# Patient Record
Sex: Male | Born: 1960
Health system: Southern US, Community
[De-identification: ages and names within clinical notes are randomized; demographics above are authoritative.]

## PROBLEM LIST (undated history)

## (undated) DIAGNOSIS — Q141 Congenital malformation of retina: Secondary | ICD-10-CM

## (undated) DIAGNOSIS — N189 Chronic kidney disease, unspecified: Secondary | ICD-10-CM

## (undated) HISTORY — DX: Congenital malformation of retina: Q14.1

## (undated) HISTORY — DX: Chronic kidney disease, unspecified: N18.9

---

## 2009-04-26 HISTORY — PX: COLONOSCOPY: SHX174

## 2009-04-28 ENCOUNTER — Encounter (INDEPENDENT_AMBULATORY_CARE_PROVIDER_SITE_OTHER): Payer: Self-pay | Admitting: *Deleted

## 2009-05-12 ENCOUNTER — Encounter (INDEPENDENT_AMBULATORY_CARE_PROVIDER_SITE_OTHER): Payer: Self-pay

## 2009-05-15 ENCOUNTER — Ambulatory Visit: Payer: Self-pay | Admitting: Internal Medicine

## 2009-05-22 ENCOUNTER — Ambulatory Visit: Payer: Self-pay | Admitting: Internal Medicine

## 2010-05-26 NOTE — Letter (Signed)
Summary: [facility] Instructions  [facility]  [address] [place], [place] [address]   Phone: [phone]  Fax: [phone]       Gary Chandler    11-07-1960    MRN: [id number]       Procedure Day /Date:  Thursday 05/22/2009     Arrival Time: 9:30 am     Procedure Time:  10:30 am     Location of Procedure:                    _ x_  [facility] (4th Floor) _   PREPARATION FOR COLONOSCOPY WITH MIRALAX  Starting 5 days prior to your procedure Saturday 1/22  do not eat nuts, seeds, popcorn, corn, beans, peas,  salads, or any raw vegetables.  Do not take any fiber supplements (e.g. Metamucil, Citrucel, and Benefiber). ____________________________________________________________________________________________________   THE DAY BEFORE YOUR PROCEDURE         DATE:Wednesday 1/26  1   Drink clear liquids the entire day-NO SOLID FOOD  2   Do not drink anything colored red or purple.  Avoid juices with pulp.  No orange juice.  3   Drink at least 64 oz. (8 glasses) of fluid/clear liquids during the day to prevent dehydration and help the prep work efficiently.  CLEAR LIQUIDS INCLUDE: Water Jello Ice Popsicles Tea (sugar ok, no milk/cream) Powdered fruit flavored drinks Coffee (sugar ok, no milk/cream) Gatorade Juice: apple, white grape, white cranberry  Lemonade Clear bullion, consomm, broth Carbonated beverages (any kind) Strained chicken noodle soup Hard Candy  4   Mix the entire bottle of Miralax with 64 oz. of Gatorade/Powerade in the morning and put in the refrigerator to chill.  5   At 3:00 pm take 2 Dulcolax/Bisacodyl tablets.  6   At 4:30 pm take one Reglan/Metoclopramide tablet.  7  Starting at 5:00 pm drink one 8 oz glass of the Miralax mixture every 15-20 minutes until you have finished drinking the entire 64 oz.  You should finish drinking prep around 7:30 or 8:00 pm.  8   If you are nauseated, you may take the 2nd  Reglan/Metoclopramide tablet at 6:30 pm.        9    At 8:00 pm take 2 more DULCOLAX/Bisacodyl tablets.     THE DAY OF YOUR PROCEDURE      DATE:  Thursday 1/27  You may drink clear liquids until 8:30 am  (2 HOURS BEFORE PROCEDURE).   MEDICATION INSTRUCTIONS  Unless otherwise instructed, you should take regular prescription medications with a small sip of water as early as possible the morning of your procedure.          OTHER INSTRUCTIONS  You will need a responsible adult at least 52 years of age to accompany you and drive you home.   This person must remain in the waiting room during your procedure.  Wear loose fitting clothing that is easily removed.  Leave jewelry and other valuables at home.  However, you may wish to bring a book to read or an iPod/MP3 player to listen to music as you wait for your procedure to start.  Remove all body piercing jewelry and leave at home.  Total time from sign-in until discharge is approximately 2-3 hours.  You should go home directly after your procedure and rest.  You can resume normal activities the day after your procedure.  The day of your procedure you should not:   Drive  Make legal decisions   Operate machinery   Drink alcohol   Return to work  You will receive specific instructions about eating, activities and medications before you leave.   The above instructions have been reviewed and explained to me by   Doristine Church RN II  May 15, 2009 11:06 AM    I fully understand and can verbalize these instructions _____________________________ Date _______

## 2010-05-26 NOTE — Letter (Signed)
Summary: Previsit letter  [facility]  [address] [place], [place] [address]   Phone: [phone]  Fax: [phone]       04/28/2009 MRN: [id number]  Gary Chandler [address] [place], [place]  [address]  Dear [name],  Welcome to the Gastroenterology Division at [facility].    You are scheduled to see a nurse for your pre-procedure visit on 05/15/2009 at 11:00am on the 3rd floor at [facility], [address]  We ask that you try to arrive at our office 15 minutes prior to your appointment time to allow for check-in.  Your nurse visit will consist of discussing your medical and surgical history, your immediate family medical history, and your medications.    Please bring a complete list of all your medications or, if you prefer, bring the medication bottles and we will list them.  We will need to be aware of both prescribed and over the counter drugs.  We will need to know exact dosage information as well.  If you are on blood thinners (Coumadin, Plavix, Aggrenox, Ticlid, etc.) please call our office today/prior to your appointment, as we need to consult with your physician about holding your medication.   Please be prepared to read and sign documents such as consent forms, a financial agreement, and acknowledgement forms.  If necessary, and with your consent, a friend or relative is welcome to sit-in on the nurse visit with you.  Please bring your insurance card so that we may make a copy of it.  If your insurance requires a referral to see a specialist, please bring your referral form from your primary care physician.  No co-pay is required for this nurse visit.     If you cannot keep your appointment, please call [phone] to cancel or reschedule prior to your appointment date.  This allows [place] the opportunity to schedule an appointment for another patient in need of care.    Thank you for choosing  Gastroenterology for your medical  needs.  We appreciate the opportunity to care for you.  Please visit [place] at our website  to learn more about our practice.                     Sincerely.                                                                                                                   The Gastroenterology Division

## 2010-05-26 NOTE — Procedures (Signed)
Summary: Colonoscopy  Patient: Adonias Demore Note: All result statuses are Final unless otherwise noted.  Tests: (1) Colonoscopy (COL)   COL Colonoscopy           DONE     [facility]     [address]     [place], [place]  [address]           COLONOSCOPY PROCEDURE REPORT           PATIENT:  Righteous, Claiborne  MR#:  [id number]     BIRTHDATE:  Feb 25, 1961, 48 yrs. old  GENDER:  male           ENDOSCOPIST:  Hedwig Morton. Juanda Chance, MD     Referred by:  Mila Palmer, M.D.           PROCEDURE DATE:  05/22/2009     PROCEDURE:  Colonoscopy [address]     ASA CLASS:  Class I     INDICATIONS:  Routine Risk Screening           MEDICATIONS:   Versed 9 mg, Fentanyl 100 mcg           DESCRIPTION OF PROCEDURE:   After the risks benefits and     alternatives of the procedure were thoroughly explained, informed     consent was obtained.  Digital rectal exam was performed and     revealed no rectal masses.   The LB [id number]-[id number] endoscope     was introduced through the anus and advanced to the cecum, which     was identified by both the appendix and ileocecal valve, without     limitations.  The quality of the prep was good, using MiraLax.     The instrument was then slowly withdrawn as the colon was fully     examined.     <<PROCEDUREIMAGES>>           FINDINGS:  No polyps or cancers were seen (see image1, image2, and     image3). there was a suspicion for a small polyp at 80 cm but I     could not find it again after scope has telescoped and lumen     irrigated   Retroflexion was not performed.  The scope was then     withdrawn from the patient and the procedure completed.           COMPLICATIONS:  None           ENDOSCOPIC IMPRESSION:     1) No polyps or cancers     2) Normal colonoscopy     RECOMMENDATIONS:     1) high fiber diet           REPEAT EXAM:  In 10 year(s) for.           ______________________________     Hedwig Morton. Juanda Chance, MD           CC:           n.     eSIGNED:    Hedwig Morton. Shakaria Raphael at 05/22/2009 11:30 AM           Crissie Reese, [phone]  Note: An exclamation mark (!) indicates a result that was not dispersed into the flowsheet. Document Creation Date: 05/22/2009 11:30 AM _______________________________________________________________________  (1) Order result status: Final Collection or observation date-time: 05/22/2009 11:20 Requested date-time:  Receipt date-time:  Reported date-time:  Referring Physician:   Ordering Physician: Lina Sar [phone]) Specimen Source:  Source: Launa Grill  Order Number: [id number] Lab site:

## 2010-05-26 NOTE — Miscellaneous (Signed)
Summary: [facility] previsit-prep  Clinical Lists Changes  Medications: Added new medication of MIRALAX   POWD (POLYETHYLENE GLYCOL 3350) As per prep  instructions. - Signed Added new medication of REGLAN 10 MG  TABS (METOCLOPRAMIDE HCL) As per prep instructions. - Signed Added new medication of DULCOLAX 5 MG  TBEC (BISACODYL) Day before procedure take 2 at 3pm and 2 at 8pm. - Signed Rx of MIRALAX   POWD (POLYETHYLENE GLYCOL 3350) As per prep  instructions.;  #255gm x 0;  Signed;  Entered by: Doristine Church RN II;  Authorized by: Hart Carwin MD;  Method used: Electronically to [facility] [place] [address] [phone]*, [address], [place], [place]  [address], Ph: [phone], Fax: [phone] Rx of REGLAN 10 MG  TABS (METOCLOPRAMIDE HCL) As per prep instructions.;  #2 x 0;  Signed;  Entered by: Doristine Church RN II;  Authorized by: Hart Carwin MD;  Method used: Electronically to [facility] [place] [phone]*, [address], [place], [place]  [address], Ph: [phone], Fax: [phone] Rx of DULCOLAX 5 MG  TBEC (BISACODYL) Day before procedure take 2 at 3pm and 2 at 8pm.;  #4 x 0;  Signed;  Entered by: Doristine Church RN II;  Authorized by: Hart Carwin MD;  Method used: Electronically to [facility] [place] [phone]*, [address], [place], [place]  [address], Ph: [phone], Fax: [phone] Observations: Added new observation of ALLERGY REV: Done (05/15/2009 10:44) Added new observation of NKA: T (05/15/2009 10:44)    Prescriptions: DULCOLAX 5 MG  TBEC (BISACODYL) Day before procedure take 2 at 3pm and 2 at 8pm.  #4 x 0   Entered by:   Doristine Church RN II   Authorized by:   Hart Carwin MD   Signed by:   Doristine Church RN II on 05/15/2009   Method used:   Electronically to        [facility] [id number] (retail)       [address] [place], [place]  [address]       Ph: [phone]       Fax: [phone]   RxID:   [id number] REGLAN 10 MG  TABS (METOCLOPRAMIDE HCL) As per prep instructions.  #2 x 0   Entered by:    Doristine Church RN II   Authorized by:   Hart Carwin MD   Signed by:   Doristine Church RN II on 05/15/2009   Method used:   Electronically to        [facility] [place] [id number] (retail)       [address] [place], [place]  [address]       Ph: [phone]       Fax: [phone]   RxID:   [id number] MIRALAX   POWD (POLYETHYLENE GLYCOL 3350) As per prep  instructions.  #255gm x 0   Entered by:   Doristine Church RN II   Authorized by:   Hart Carwin MD   Signed by:   Doristine Church RN II on 05/15/2009   Method used:   Electronically to        [facility] [id number] (retail)       [address] [place], [place]  [address]       Ph: [phone]       Fax: [phone]   RxID:   [id number]

## 2014-10-27 ENCOUNTER — Ambulatory Visit (INDEPENDENT_AMBULATORY_CARE_PROVIDER_SITE_OTHER): Payer: Worker's Compensation | Admitting: Family Medicine

## 2014-10-27 VITALS — BP 118/74 | HR 85 | Temp 98.0°F | Resp 16 | Ht 71.0 in | Wt 233.0 lb

## 2014-10-27 DIAGNOSIS — S0100XA Unspecified open wound of scalp, initial encounter: Secondary | ICD-10-CM

## 2014-10-27 NOTE — Patient Instructions (Addendum)
Keep area clean/covered at work.  Wound care as discussed below, and see information on head injuries.  If you do have any headaches or worsening symptoms - be seen here or in emergency room. Return to the clinic or go to the nearest emergency room if any of your symptoms worsen or new symptoms occur.  WOUND CARE Please return in 7 days to have your stitches/staples removed or sooner if you have concerns. Marland Kitchen Keep area clean and dry for 24 hours. Do not remove bandage, if applied. . After 24 hours, remove bandage and wash wound gently with mild soap and warm water. Reapply a new bandage after cleaning wound, if directed. . Continue daily cleansing with soap and water until stitches/staples are removed. . Do not apply any ointments or creams to the wound while stitches/staples are in place, as this may cause delayed healing. . Notify the office if you experience any of the following signs of infection: Swelling, redness, pus drainage, streaking, fever >101.0 F . Notify the office if you experience excessive bleeding that does not stop after 15-20 minutes of constant, firm pressure.  HEAD INJURY If any of the following occur notify your physician or go to the Hospital Emergency Department: . Increased drowsiness, stupor or loss of consciousness . Restlessness or convulsions (fits) . Paralysis in arms or legs . Temperature above 100 F . Vomiting . Severe headache . Blood or clear fluid dripping from the nose or ears . Stiffness of the neck . Dizziness or blurred vision . Pulsating pain in the eye . Unequal pupils of eye . Personality changes . Any other unusual symptoms PRECAUTIONS . Keep head elevated at all times for the first 24 hours (Elevate mattress if pillow is ineffective) . Do not take tranquilizers, sedatives, narcotics or alcohol . Avoid aspirin. Use only acetaminophen (e.g. Tylenol) or ibuprofen (e.g. Advil) for relief of pain. Follow directions on the bottle for  dosage. . Use ice packs for comfort MEDICATIONS Use medications only as directed by your physician

## 2014-10-27 NOTE — Progress Notes (Addendum)
Subjective:  This chart was scribed for Merri Ray, MD by Moises Blood, Medical Scribe. This patient was seen in room 6 and the patient's care was started 3:51 PM.    Patient ID: Gary Chandler, male    DOB: 1960-09-23, 54 y.o.   MRN: [id number]  HPI Gary Chandler is a 54 y.o. male He cut the left side of his head on a metal frame window at 3:00 PM today while working. 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 He was bleeding at the time and did not use any medication or apply anything on the cut other than gauze to stop to stop the bleeding. He had his last tetanus shot 4 years ago. He denies syncope, dizziness, and headaches. NKDA. He denies any problems with anesthesia in the past.   He works at [employer] as a Scientist, clinical (histocompatibility and immunogenetics).   NKI, no reactions to anesthesia.   There are no active problems to display for this patient.  No past medical history on file. No past surgical history on file. No Known Allergies Prior to Admission medications   Not on File   History   Social History  . Marital Status: Married    Spouse Name: N/A  . Number of Children: N/A  . Years of Education: N/A   Occupational History  . Not on file.   Social History Main Topics  . Smoking status: Never Smoker   . Smokeless tobacco: Not on file  . Alcohol Use: Not on file  . Drug Use: Not on file  . Sexual Activity: Not on file   Other Topics Concern  . Not on file   Social History Narrative  . No narrative on file     Review of Systems  Constitutional: Negative for fever and fatigue.  Skin: Positive for wound (left scalp).  Neurological: Negative for dizziness, syncope and headaches.       Objective:   Physical Exam  Constitutional: He is oriented to person, place, and time. He appears well-developed and well-nourished. No distress.  HENT:  Head: Normocephalic and atraumatic.  Eyes: EOM are normal. Pupils are equal,  round, and reactive to light.  Neck: Neck supple.  Cardiovascular: Normal rate.   Pulmonary/Chest: Effort normal. No respiratory distress.  Musculoskeletal: Normal range of motion.  Neurological: He is alert and oriented to person, place, and time.  Skin: Skin is warm and dry.  left parietal scalp 2.6cm curvilinear laceration.  minimal blood at wound, but no significant active bleeding. Wound did widen with tension but no bone visualized  Psychiatric: He has a normal mood and affect. His behavior is normal.  Nursing note and vitals reviewed.  Filed Vitals:   10/27/14 1551  BP: 118/74  Pulse: 85  Temp: 98 F (36.7 C)  TempSrc: Oral  Resp: 16  Height: 5\' 11"  (1.803 m)  Weight: 233 lb (105.688 kg)  SpO2: 98%       Assessment & Plan:  Gary Chandler is a 54 y.o. male Open wound of scalp, initial encounter  Wound of scalp due to injury at work today.  Repaired per procedure note. Denies any headache or others signs or symptoms of head injury.  Wound care and head injury precautions were discussed as well as given on handout.  Full duty no restrictions, return for suture removal in 7 days.   No orders of the defined types were placed in this  encounter.   Patient Instructions  Keep area clean/covered at work.  Wound care as discussed below, and see information on head injuries.  If you do have any headaches or worsening symptoms - be seen here or in emergency room. Return to the clinic or go to the nearest emergency room if any of your symptoms worsen or new symptoms occur.  WOUND CARE Please return in 7 days to have your stitches/staples removed or sooner if you have concerns. Marland Kitchen Keep area clean and dry for 24 hours. Do not remove bandage, if applied. . After 24 hours, remove bandage and wash wound gently with mild soap and warm water. Reapply a new bandage after cleaning wound, if directed. . Continue daily cleansing with soap and water until stitches/staples are removed. . Do  not apply any ointments or creams to the wound while stitches/staples are in place, as this may cause delayed healing. . Notify the office if you experience any of the following signs of infection: Swelling, redness, pus drainage, streaking, fever >101.0 F . Notify the office if you experience excessive bleeding that does not stop after 15-20 minutes of constant, firm pressure.  HEAD INJURY If any of the following occur notify your physician or go to the Hospital Emergency Department: . Increased drowsiness, stupor or loss of consciousness . Restlessness or convulsions (fits) . Paralysis in arms or legs . Temperature above 100 F . Vomiting . Severe headache . Blood or clear fluid dripping from the nose or ears . Stiffness of the neck . Dizziness or blurred vision . Pulsating pain in the eye . Unequal pupils of eye . Personality changes . Any other unusual symptoms PRECAUTIONS . Keep head elevated at all times for the first 24 hours (Elevate mattress if pillow is ineffective) . Do not take tranquilizers, sedatives, narcotics or alcohol . Avoid aspirin. Use only acetaminophen (e.g. Tylenol) or ibuprofen (e.g. Advil) for relief of pain. Follow directions on the bottle for dosage. . Use ice packs for comfort MEDICATIONS Use medications only as directed by your physician        I personally performed the services described in this documentation, which was scribed in my presence. The recorded information has been reviewed and considered, and addended by me as needed.

## 2014-10-27 NOTE — Progress Notes (Signed)
Procedure: Risk and benefits discussed and verbal consent obtained. The patient was anesthetized using 5 cc of 2% lidocaine with epinephrine. The wound was scrubbed with soap and water. Wound explored and no visible skull or periosteum visualized. Sterile prep and drape. The wound was closed with 5 SI 4-0 Ethilon sutures. Edges well approximated.  The wound was then cleaned with sterile water. Patient instructed to come back for suture removal in 7 days. Philis Fendt, MS, PA-C   4:29 PM, 10/27/2014

## 2014-11-07 ENCOUNTER — Ambulatory Visit
Admission: RE | Admit: 2014-11-07 | Discharge: 2014-11-07 | Disposition: A | Discharge status: Home / Self Care | Payer: PRIVATE HEALTH INSURANCE | Source: Ambulatory Visit | Attending: Occupational Medicine | Admitting: Occupational Medicine

## 2014-11-07 ENCOUNTER — Other Ambulatory Visit: Payer: Self-pay | Admitting: Occupational Medicine

## 2014-11-07 DIAGNOSIS — Z0000 Encounter for general adult medical examination without abnormal findings: Secondary | ICD-10-CM

## 2015-01-28 ENCOUNTER — Encounter: Payer: Self-pay | Admitting: Internal Medicine

## 2015-08-20 DIAGNOSIS — H40013 Open angle with borderline findings, low risk, bilateral: Secondary | ICD-10-CM | POA: Diagnosis not present

## 2015-08-20 DIAGNOSIS — H353131 Nonexudative age-related macular degeneration, bilateral, early dry stage: Secondary | ICD-10-CM | POA: Diagnosis not present

## 2016-02-03 DIAGNOSIS — Z23 Encounter for immunization: Secondary | ICD-10-CM | POA: Diagnosis not present

## 2016-03-11 DIAGNOSIS — Z0000 Encounter for general adult medical examination without abnormal findings: Secondary | ICD-10-CM | POA: Diagnosis not present

## 2016-03-11 DIAGNOSIS — Z1211 Encounter for screening for malignant neoplasm of colon: Secondary | ICD-10-CM | POA: Diagnosis not present

## 2016-03-11 DIAGNOSIS — Z1159 Encounter for screening for other viral diseases: Secondary | ICD-10-CM | POA: Diagnosis not present

## 2016-03-11 DIAGNOSIS — Z125 Encounter for screening for malignant neoplasm of prostate: Secondary | ICD-10-CM | POA: Diagnosis not present

## 2016-03-11 DIAGNOSIS — E785 Hyperlipidemia, unspecified: Secondary | ICD-10-CM | POA: Diagnosis not present

## 2016-08-23 DIAGNOSIS — H353121 Nonexudative age-related macular degeneration, left eye, early dry stage: Secondary | ICD-10-CM | POA: Diagnosis not present

## 2016-08-23 DIAGNOSIS — H353112 Nonexudative age-related macular degeneration, right eye, intermediate dry stage: Secondary | ICD-10-CM | POA: Diagnosis not present

## 2016-08-23 DIAGNOSIS — H40013 Open angle with borderline findings, low risk, bilateral: Secondary | ICD-10-CM | POA: Diagnosis not present

## 2016-11-17 ENCOUNTER — Emergency Department (HOSPITAL_COMMUNITY): Payer: BLUE CROSS/BLUE SHIELD

## 2016-11-17 ENCOUNTER — Emergency Department (HOSPITAL_COMMUNITY)
Admission: EM | Admit: 2016-11-17 | Discharge: 2016-11-17 | Disposition: A | Discharge status: Home / Self Care | Payer: BLUE CROSS/BLUE SHIELD | Attending: Emergency Medicine | Admitting: Emergency Medicine

## 2016-11-17 DIAGNOSIS — Y939 Activity, unspecified: Secondary | ICD-10-CM | POA: Diagnosis not present

## 2016-11-17 DIAGNOSIS — S81812A Laceration without foreign body, left lower leg, initial encounter: Secondary | ICD-10-CM

## 2016-11-17 DIAGNOSIS — W312XXA Contact with powered woodworking and forming machines, initial encounter: Secondary | ICD-10-CM | POA: Diagnosis not present

## 2016-11-17 DIAGNOSIS — S8992XA Unspecified injury of left lower leg, initial encounter: Secondary | ICD-10-CM | POA: Diagnosis not present

## 2016-11-17 DIAGNOSIS — Y999 Unspecified external cause status: Secondary | ICD-10-CM | POA: Diagnosis not present

## 2016-11-17 DIAGNOSIS — Z23 Encounter for immunization: Secondary | ICD-10-CM | POA: Diagnosis not present

## 2016-11-17 DIAGNOSIS — M79662 Pain in left lower leg: Secondary | ICD-10-CM | POA: Diagnosis not present

## 2016-11-17 DIAGNOSIS — Y929 Unspecified place or not applicable: Secondary | ICD-10-CM | POA: Insufficient documentation

## 2016-11-17 MED ORDER — LIDOCAINE HCL (PF) 1 % IJ SOLN
20.0000 mL | Freq: Once | INTRAMUSCULAR | Status: DC
Start: 2016-11-17 — End: 2016-11-17

## 2016-11-17 MED ORDER — HYDROCODONE-ACETAMINOPHEN 5-325 MG PO TABS
2.0000 | ORAL_TABLET | Freq: Once | ORAL | Status: AC
  Administered 2016-11-17: 2 via ORAL
  Filled 2016-11-17: qty 2

## 2016-11-17 MED ORDER — BACITRACIN ZINC 500 UNIT/GM EX OINT: TOPICAL_OINTMENT | Freq: Two times a day (BID) | CUTANEOUS | Status: DC

## 2016-11-17 MED ORDER — LIDOCAINE HCL 1 % IJ SOLN
INTRAMUSCULAR | Status: AC
  Administered 2016-11-17: 20 mL
  Filled 2016-11-17: qty 20

## 2016-11-17 MED ORDER — TETANUS-DIPHTH-ACELL PERTUSSIS 5-2.5-18.5 LF-MCG/0.5 IM SUSP
0.5000 mL | Freq: Once | INTRAMUSCULAR | Status: AC
  Administered 2016-11-17: 0.5 mL via INTRAMUSCULAR
  Filled 2016-11-17: qty 0.5

## 2016-11-17 MED ORDER — HYDROCODONE-ACETAMINOPHEN 5-325 MG PO TABS: 1.0000 | ORAL_TABLET | Freq: Four times a day (QID) | ORAL | 0 refills | Status: DC | PRN

## 2016-11-17 NOTE — ED Triage Notes (Signed)
Pt states he slipped while cutting something with a chainsaw and cut his L shin. Fatty tissue showing. Bleeding moderately controlled. Alert and oriented.

## 2016-11-17 NOTE — ED Provider Notes (Signed)
[facility] DEPT Provider Note   CSN: [id number] Arrival date & time: 11/17/16  1420     History   Chief Complaint Chief Complaint  Patient presents with  . Laceration    HPI Gary Chandler is a 56 y.o. male who presents with a laceration to his left lower extremity. Patient states that this morning him and his friend were using a pole saw to complete a project when his friend accidentally hit his leg with the fall. Patient states that he has been able to ambulate since incident. On ED arrival, the wound is bandage and bleeding is controlled. Patient has not taken any medications for the pain. Patient denies any numbness/weakness of the leg. Patient is not on any blood thinners. Patient's tetanus is not up-to-date.     The history is provided by the patient.    No past medical history on file.  There are no active problems to display for this patient.   No past surgical history on file.     Home Medications    Prior to Admission medications   Medication Sig Start Date End Date Taking? Authorizing Provider  HYDROcodone-acetaminophen (NORCO/VICODIN) 5-325 MG tablet Take 1-2 tablets by mouth every 6 (six) hours as needed. 11/17/16   Volanda Napoleon, PA-C    Family History No family history on file.  Social History Social History  Substance Use Topics  . Smoking status: Never Smoker  . Smokeless tobacco: Not on file  . Alcohol use Not on file     Allergies   Patient has no known allergies.   Review of Systems Review of Systems  Skin: Positive for wound.  Neurological: Negative for weakness and numbness.     Physical Exam Updated Vital Signs BP (!) 153/93 (BP Location: Right Arm)   Pulse 99   Temp 97.8 F (36.6 C) (Oral)   Resp 16   SpO2 99%   Physical Exam  Constitutional: He is oriented to person, place, and time. He appears well-developed and well-nourished.  Sitting comfortably on examination table  HENT:  Head: Normocephalic and atraumatic.    Eyes: Conjunctivae and EOM are normal. Right eye exhibits no discharge. Left eye exhibits no discharge. No scleral icterus.  Cardiovascular:  Pulses:      Dorsalis pedis pulses are 2+ on the right side, and 2+ on the left side.  Pulmonary/Chest: Effort normal.  Musculoskeletal:  Full range of motion of left lower extremity without any difficulty. Full flexion extension of knee without difficulty. Full flexion extension of left ankle without difficulty.  Neurological: He is alert and oriented to person, place, and time. GCS eye subscore is 4. GCS verbal subscore is 5. GCS motor subscore is 6.  Sensation intact throughout all major nerve distributions  Skin: Skin is warm and dry. Capillary refill takes less than 2 seconds.  Approximately 7 cm linear wound to the anterior aspect of the left shin with proximal abrasions and presence of subcutaneous fat. No evidence of muscle involvement.   Psychiatric: He has a normal mood and affect. His speech is normal and behavior is normal.  Nursing note and vitals reviewed.      ED Treatments / Results  Labs (all labs ordered are listed, but only abnormal results are displayed) Labs Reviewed - No data to display  EKG  EKG Interpretation None       Radiology Dg Tibia/fibula Left  Result Date: 11/17/2016 CLINICAL DATA:  Slipped while cutting something with a chain saw, cut his LEFT  shin, bleeding, fatty tissue showing EXAM: LEFT TIBIA AND FIBULA - 2 VIEW COMPARISON:  None FINDINGS: Osseous mineralization normal. Knee and ankle joint alignments normal. Joint spaces preserved. No acute fracture, dislocation, or bone destruction. Tiny metallic foreign bodies are identified at the proximal LEFT lower leg. Soft tissue swelling and question laceration at the lateral aspect of the distal LEFT lower leg without underlying bony abnormality. IMPRESSION: No acute osseous abnormalities. Tiny metallic foreign bodies at the proximal lower LEFT leg.  Electronically Signed   By: Lavonia Dana M.D.   On: 11/17/2016 16:05    Procedures .Marland KitchenLaceration Repair Date/Time: 11/17/2016 3:57 PM Performed by: Providence Lanius A Authorized by: Sharlett Iles   Consent:    Consent obtained:  Verbal   Consent given by:  Patient   Risks discussed:  Infection, pain and retained foreign body Anesthesia (see MAR for exact dosages):    Anesthesia method:  Local infiltration   Local anesthetic:  Lidocaine 1% w/o epi Laceration details:    Location:  Leg   Leg location:  L lower leg   Length (cm):  7 Repair type:    Repair type:  Simple Pre-procedure details:    Preparation:  Patient was prepped and draped in usual sterile fashion Exploration:    Hemostasis achieved with:  Direct pressure   Wound exploration: wound explored through full range of motion     Wound extent: no foreign bodies/material noted and no muscle damage noted   Treatment:    Area cleansed with:  Betadine   Amount of cleaning:  Extensive   Irrigation solution:  Sterile saline   Irrigation method:  Pressure wash and syringe   Visualized foreign bodies/material removed: no   Skin repair:    Repair method:  Sutures   Suture size:  3-0   Suture material:  Nylon   Suture technique:  Simple interrupted   Number of sutures:  20 Approximation:    Approximation:  Close   Vermilion border: well-aligned   Post-procedure details:    Dressing:  Antibiotic ointment and sterile dressing   Patient tolerance of procedure:  Tolerated well, no immediate complications Comments:     The wound was extensively irrigated sterile saline and combiguard. These area surrounding the wound was cleaned with Betadine and the patient was anesthetized. Once anesthetized, the wound was thoroughly cleaned and irrigated with extensive sterile saline. The wound was explored through full ROM. No evidence of muscle or fascia involvement.  No evidence of foreign bodies. The wound was repaired with sutures as  augmented above. Patient tolerated procedure well.     (including critical care time)     Medications Ordered in ED Medications  bacitracin ointment (not administered)  Tdap (BOOSTRIX) injection 0.5 mL (0.5 mLs Intramuscular Given 11/17/16 1558)  lidocaine (XYLOCAINE) 1 % (with pres) injection (20 mLs  Given by Other 11/17/16 1704)  HYDROcodone-acetaminophen (NORCO/VICODIN) 5-325 MG per tablet 2 tablet (2 tablets Oral Given 11/17/16 1704)     Initial Impression / Assessment and Plan / ED Course  I have reviewed the triage vital signs and the nursing notes.  Pertinent labs & imaging results that were available during my care of the patient were reviewed by me and considered in my medical decision making (see chart for details).     56 year old male who presents with laceration to the left anterior shin after being hit with a chainsaw. Patient is afebrile, non-toxic appearing, sitting comfortably on examination table. Vital signs reviewed and stable. Patient  is neurovascularly intact. Tenderness is not up-to-date. Plan to provide wound care and update tetanus in the department. Plan to x-ray for evaluation of fracture or foreign body.  X-rays negative for any acute fracture or dislocation. There is notable mention of small metallic foreign bodies. Plan to thoroughly irrigate and wash the wound to remove the foreign bodies.  Wound repaired as documented above. Patient tolerated procedure well. Conservative therapy is discussed. Will plan to treat patient's pain. Wound care instructions discussed with patient. Strict return precautions discussed. Patient expresses understanding and agreement plan.   Final Clinical Impressions(s) / ED Diagnoses   Final diagnoses:  Laceration of left lower extremity, initial encounter    New Prescriptions Discharge Medication List as of 11/17/2016  6:53 PM    START taking these medications   Details  HYDROcodone-acetaminophen (NORCO/VICODIN) 5-325 MG  tablet Take 1-2 tablets by mouth every 6 (six) hours as needed., Starting Wed 11/17/2016, Print         Volanda Napoleon, PA-C 11/19/16 1219    Little, Wenda Overland, MD 11/19/16 1226

## 2016-11-17 NOTE — ED Notes (Signed)
Bed: WTR5 Expected date:  Expected time:  Means of arrival:  Comments: 

## 2016-11-17 NOTE — Discharge Instructions (Signed)
Keep the wound clean and dry for the first 24 hours. After that he may gently wash with soap and water and patent dry afterward. Make sure that the wound is completely dry before placing any dressing on it. You can use over-the-counter bacitracin or Neosporin on the wound site.  Follow-up with your doctor or return to the Emergency Department in 7-10 days to have your sutures removed.  Heart transplant for any signs of infection, including redness and swelling to the site that starts to spread, drainage from the site, redness and swelling of the leg, fever or any other worsening or concerning symptoms. If he noticed the symptoms please return to emergency department immediately.

## 2017-02-22 DIAGNOSIS — H353112 Nonexudative age-related macular degeneration, right eye, intermediate dry stage: Secondary | ICD-10-CM | POA: Diagnosis not present

## 2017-02-22 DIAGNOSIS — H353121 Nonexudative age-related macular degeneration, left eye, early dry stage: Secondary | ICD-10-CM | POA: Diagnosis not present

## 2017-04-18 DIAGNOSIS — Z0000 Encounter for general adult medical examination without abnormal findings: Secondary | ICD-10-CM | POA: Diagnosis not present

## 2017-04-18 DIAGNOSIS — E785 Hyperlipidemia, unspecified: Secondary | ICD-10-CM | POA: Diagnosis not present

## 2017-04-18 DIAGNOSIS — N183 Chronic kidney disease, stage 3 (moderate): Secondary | ICD-10-CM | POA: Diagnosis not present

## 2017-04-18 DIAGNOSIS — Z23 Encounter for immunization: Secondary | ICD-10-CM | POA: Diagnosis not present

## 2017-06-21 DIAGNOSIS — E7800 Pure hypercholesterolemia, unspecified: Secondary | ICD-10-CM | POA: Diagnosis not present

## 2017-06-22 DIAGNOSIS — Z23 Encounter for immunization: Secondary | ICD-10-CM | POA: Diagnosis not present

## 2017-08-17 DIAGNOSIS — H353112 Nonexudative age-related macular degeneration, right eye, intermediate dry stage: Secondary | ICD-10-CM | POA: Diagnosis not present

## 2017-08-17 DIAGNOSIS — H353121 Nonexudative age-related macular degeneration, left eye, early dry stage: Secondary | ICD-10-CM | POA: Diagnosis not present

## 2017-09-13 DIAGNOSIS — J019 Acute sinusitis, unspecified: Secondary | ICD-10-CM | POA: Diagnosis not present

## 2018-01-05 DIAGNOSIS — D235 Other benign neoplasm of skin of trunk: Secondary | ICD-10-CM | POA: Diagnosis not present

## 2018-01-05 DIAGNOSIS — D3617 Benign neoplasm of peripheral nerves and autonomic nervous system of trunk, unspecified: Secondary | ICD-10-CM | POA: Diagnosis not present

## 2018-02-16 DIAGNOSIS — H40013 Open angle with borderline findings, low risk, bilateral: Secondary | ICD-10-CM | POA: Diagnosis not present

## 2018-02-16 DIAGNOSIS — H353121 Nonexudative age-related macular degeneration, left eye, early dry stage: Secondary | ICD-10-CM | POA: Diagnosis not present

## 2018-02-16 DIAGNOSIS — H353112 Nonexudative age-related macular degeneration, right eye, intermediate dry stage: Secondary | ICD-10-CM | POA: Diagnosis not present

## 2018-05-23 DIAGNOSIS — E7800 Pure hypercholesterolemia, unspecified: Secondary | ICD-10-CM | POA: Diagnosis not present

## 2018-05-23 DIAGNOSIS — E785 Hyperlipidemia, unspecified: Secondary | ICD-10-CM | POA: Diagnosis not present

## 2018-05-23 DIAGNOSIS — N183 Chronic kidney disease, stage 3 (moderate): Secondary | ICD-10-CM | POA: Diagnosis not present

## 2018-05-23 DIAGNOSIS — Z0000 Encounter for general adult medical examination without abnormal findings: Secondary | ICD-10-CM | POA: Diagnosis not present

## 2018-05-23 DIAGNOSIS — D649 Anemia, unspecified: Secondary | ICD-10-CM | POA: Diagnosis not present

## 2018-05-24 DIAGNOSIS — Z1211 Encounter for screening for malignant neoplasm of colon: Secondary | ICD-10-CM | POA: Diagnosis not present

## 2018-07-25 DIAGNOSIS — D649 Anemia, unspecified: Secondary | ICD-10-CM | POA: Diagnosis not present

## 2019-02-06 DIAGNOSIS — H353121 Nonexudative age-related macular degeneration, left eye, early dry stage: Secondary | ICD-10-CM | POA: Diagnosis not present

## 2019-02-06 DIAGNOSIS — H353112 Nonexudative age-related macular degeneration, right eye, intermediate dry stage: Secondary | ICD-10-CM | POA: Diagnosis not present

## 2019-02-06 DIAGNOSIS — H40013 Open angle with borderline findings, low risk, bilateral: Secondary | ICD-10-CM | POA: Diagnosis not present

## 2019-02-06 DIAGNOSIS — D3132 Benign neoplasm of left choroid: Secondary | ICD-10-CM | POA: Diagnosis not present

## 2019-06-06 ENCOUNTER — Encounter: Payer: Self-pay | Admitting: Gastroenterology

## 2019-06-13 ENCOUNTER — Other Ambulatory Visit: Payer: Self-pay

## 2019-06-13 ENCOUNTER — Ambulatory Visit (AMBULATORY_SURGERY_CENTER): Payer: BC Managed Care – PPO

## 2019-06-13 VITALS — Temp 98.0°F | Ht 71.0 in | Wt 214.4 lb

## 2019-06-13 DIAGNOSIS — Z1211 Encounter for screening for malignant neoplasm of colon: Secondary | ICD-10-CM

## 2019-06-13 DIAGNOSIS — Z01818 Encounter for other preprocedural examination: Secondary | ICD-10-CM

## 2019-06-13 NOTE — Progress Notes (Signed)

## 2019-06-22 ENCOUNTER — Other Ambulatory Visit: Payer: Self-pay | Admitting: Gastroenterology

## 2019-06-22 ENCOUNTER — Ambulatory Visit (INDEPENDENT_AMBULATORY_CARE_PROVIDER_SITE_OTHER): Payer: BC Managed Care – PPO

## 2019-06-22 ENCOUNTER — Encounter: Payer: Self-pay | Admitting: Gastroenterology

## 2019-06-22 ENCOUNTER — Other Ambulatory Visit: Payer: Self-pay

## 2019-06-22 DIAGNOSIS — Z1159 Encounter for screening for other viral diseases: Secondary | ICD-10-CM

## 2019-06-22 LAB — SARS CORONAVIRUS 2 (TAT 6-24 HRS): SARS Coronavirus 2: NEGATIVE

## 2019-06-27 ENCOUNTER — Ambulatory Visit (AMBULATORY_SURGERY_CENTER): Payer: BC Managed Care – PPO | Admitting: Gastroenterology

## 2019-06-27 ENCOUNTER — Encounter: Payer: Self-pay | Admitting: Gastroenterology

## 2019-06-27 ENCOUNTER — Other Ambulatory Visit: Payer: Self-pay

## 2019-06-27 VITALS — BP 125/74 | HR 72 | Temp 93.5°F | Resp 14 | Ht 71.0 in | Wt 214.4 lb

## 2019-06-27 DIAGNOSIS — Z1211 Encounter for screening for malignant neoplasm of colon: Secondary | ICD-10-CM

## 2019-06-27 DIAGNOSIS — D123 Benign neoplasm of transverse colon: Secondary | ICD-10-CM | POA: Diagnosis not present

## 2019-06-27 MED ORDER — SODIUM CHLORIDE 0.9 % IV SOLN
500.0000 mL | Freq: Once | INTRAVENOUS | Status: DC
Start: 2019-06-27 — End: 2019-06-27

## 2019-06-27 NOTE — Progress Notes (Signed)
VS- Nash Mantis Temperature- June Bullock  Pt's states no medical or surgical changes since previsit or office visit.

## 2019-06-27 NOTE — Progress Notes (Signed)
Called to room to assist during endoscopic procedure.  Patient ID and intended procedure confirmed with present staff. Received instructions for my participation in the procedure from the performing physician.  

## 2019-06-27 NOTE — Op Note (Signed)
[facility] Patient Name: Gary Chandler Procedure Date: 06/27/2019 8:01 AM MRN: [id number] Endoscopist: Thornton Park MD, MD Age: 59 Referring MD:  Date of Birth: 06-Sep-1960 Gender: Male Account #: [id number] Procedure:                Colonoscopy Indications:              Screening for colorectal malignant neoplasm                           Normal colonoscopy with Dr. Olevia Perches 05/22/2009                           No known family history of colon cancer or polyps Medicines:                Monitored Anesthesia Care Procedure:                Pre-Anesthesia Assessment:                           - Prior to the procedure, a History and Physical                            was performed, and patient medications and                            allergies were reviewed. The patient's tolerance of                            previous anesthesia was also reviewed. The risks                            and benefits of the procedure and the sedation                            options and risks were discussed with the patient.                            All questions were answered, and informed consent                            was obtained. Prior Anticoagulants: The patient has                            taken no previous anticoagulant or antiplatelet                            agents. ASA Grade Assessment: III - A patient with                            severe systemic disease. After reviewing the risks                            and benefits, the patient was deemed in  satisfactory condition to undergo the procedure.                           After obtaining informed consent, the colonoscope                            was passed under direct vision. Throughout the                            procedure, the patient's blood pressure, pulse, and                            oxygen saturations were monitored continuously. The                            Colonoscope was  introduced through the anus and                            advanced to the the cecum, identified by                            appendiceal orifice and ileocecal valve. A second                            forward view of the right colon was performed. The                            colonoscopy was performed without difficulty. The                            patient tolerated the procedure well. The quality                            of the bowel preparation was good. The ileocecal                            valve, appendiceal orifice, and rectum were                            photographed. Scope In: 8:20:33 AM Scope Out: 8:35:03 AM Scope Withdrawal Time: 0 hours 11 minutes 6 seconds  Total Procedure Duration: 0 hours 14 minutes 30 seconds  Findings:                 The perianal and digital rectal examinations were                            normal.                           Non-bleeding internal hemorrhoids were found. The                            hemorrhoids were small.  A 2 mm polyp was found in the hepatic flexure. The                            polyp was sessile. The polyp was removed with a                            cold snare. Resection and retrieval were complete.                            Estimated blood loss was minimal.                           The exam was otherwise without abnormality on                            direct and retroflexion views. Complications:            No immediate complications. Estimated blood loss:                            Minimal. Estimated Blood Loss:     Estimated blood loss was minimal. Impression:               - Non-bleeding internal hemorrhoids.                           - One 2 mm polyp at the hepatic flexure, removed                            with a cold snare. Resected and retrieved.                           - The examination was otherwise normal on direct                            and retroflexion  views. Recommendation:           - Patient has a contact number available for                            emergencies. The signs and symptoms of potential                            delayed complications were discussed with the                            patient. Return to normal activities tomorrow.                            Written discharge instructions were provided to the                            patient.                           - Resume previous diet.                           -  Continue present medications.                           - Await pathology results.                           - Repeat colonoscopy in 7 years for surveillance if                            the polyp that was removed today is an adenoma.                           - Emerging evidence supports eating a diet of                            fruits, vegetables, grains, calcium, and yogurt                            while reducing red meat and alcohol may reduce the                            risk of colon cancer.                           - Thank you for allowing me to be involved in your                            colon cancer prevention. Thornton Park MD, MD 06/27/2019 8:42:36 AM This report has been signed electronically.

## 2019-06-27 NOTE — Patient Instructions (Signed)
YOU HAD AN ENDOSCOPIC PROCEDURE TODAY AT THE [facility]:   Refer to the procedure report that was given to you for any specific questions about what was found during the examination.  If the procedure report does not answer your questions, please call your gastroenterologist to clarify.  If you requested that your care partner not be given the details of your procedure findings, then the procedure report has been included in a sealed envelope for you to review at your convenience later.  YOU SHOULD EXPECT: Some feelings of bloating in the abdomen. Passage of more gas than usual.  Walking can help get rid of the air that was put into your GI tract during the procedure and reduce the bloating. If you had a lower endoscopy (such as a colonoscopy or flexible sigmoidoscopy) you may notice spotting of blood in your stool or on the toilet paper. If you underwent a bowel prep for your procedure, you may not have a normal bowel movement for a few days.  Please Note:  You might notice some irritation and congestion in your nose or some drainage.  This is from the oxygen used during your procedure.  There is no need for concern and it should clear up in a day or so.  SYMPTOMS TO REPORT IMMEDIATELY:   Following lower endoscopy (colonoscopy or flexible sigmoidoscopy):  Excessive amounts of blood in the stool  Significant tenderness or worsening of abdominal pains  Swelling of the abdomen that is new, acute  Fever of 100F or higher  For urgent or emergent issues, a gastroenterologist can be reached at any hour by calling [phone]. Do not use MyChart messaging for urgent concerns.    DIET:  We do recommend a small meal at first, but then you may proceed to your regular diet.  Drink plenty of fluids but you should avoid alcoholic beverages for 24 hours.  ACTIVITY:  You should plan to take it easy for the rest of today and you should NOT DRIVE or use heavy machinery until tomorrow (because  of the sedation medicines used during the test).    FOLLOW UP: Our staff will call the number listed on your records 48-72 hours following your procedure to check on you and address any questions or concerns that you may have regarding the information given to you following your procedure. If we do not reach you, we will leave a message.  We will attempt to reach you two times.  During this call, we will ask if you have developed any symptoms of COVID 19. If you develop any symptoms (ie: fever, flu-like symptoms, shortness of breath, cough etc.) before then, please call [phone].  If you test positive for Covid 19 in the 2 weeks post procedure, please call and report this information to us.    If any biopsies were taken you will be contacted by phone or by letter within the next 1-3 weeks.  Please call us at [phone] if you have not heard about the biopsies in 3 weeks.    SIGNATURES/CONFIDENTIALITY: You and/or your care partner have signed paperwork which will be entered into your electronic medical record.  These signatures attest to the fact that that the information above on your After Visit Summary has been reviewed and is understood.  Full responsibility of the confidentiality of this discharge information lies with you and/or your care-partner. 

## 2019-06-27 NOTE — Progress Notes (Signed)
Report given to PACU, vss 

## 2019-06-29 ENCOUNTER — Telehealth: Payer: Self-pay

## 2019-06-29 ENCOUNTER — Encounter: Payer: Self-pay | Admitting: Gastroenterology

## 2019-06-29 NOTE — Telephone Encounter (Signed)
  Follow up Call-  Call back number 06/27/2019  Post procedure Call Back phone  # [phone]  Permission to leave phone message Yes  Some recent data might be hidden     Patient questions:  Do you have a fever, pain , or abdominal swelling? No. Pain Score  0 *  Have you tolerated food without any problems? Yes.    Have you been able to return to your normal activities? Yes.    Do you have any questions about your discharge instructions: Diet   No. Medications  No. Follow up visit  No.  Do you have questions or concerns about your Care? No.  Actions: * If pain score is 4 or above: No action needed, pain <4.  1. Have you developed a fever since your procedure? No  2.   Have you had an respiratory symptoms (SOB or cough) since your procedure? No 3.   Have you tested positive for COVID 19 since your procedure No 4.   Have you had any family members/close contacts diagnosed with the COVID 19 since your procedure?  No   If yes to any of these questions please route to Joylene John, RN and Alphonsa Gin, RN.

## 2019-08-06 DIAGNOSIS — Z125 Encounter for screening for malignant neoplasm of prostate: Secondary | ICD-10-CM | POA: Diagnosis not present

## 2019-08-06 DIAGNOSIS — E785 Hyperlipidemia, unspecified: Secondary | ICD-10-CM | POA: Diagnosis not present

## 2019-08-06 DIAGNOSIS — D649 Anemia, unspecified: Secondary | ICD-10-CM | POA: Diagnosis not present

## 2019-08-06 DIAGNOSIS — Z0000 Encounter for general adult medical examination without abnormal findings: Secondary | ICD-10-CM | POA: Diagnosis not present

## 2019-08-23 DIAGNOSIS — D3132 Benign neoplasm of left choroid: Secondary | ICD-10-CM | POA: Diagnosis not present

## 2019-08-23 DIAGNOSIS — H40013 Open angle with borderline findings, low risk, bilateral: Secondary | ICD-10-CM | POA: Diagnosis not present

## 2019-08-23 DIAGNOSIS — H353112 Nonexudative age-related macular degeneration, right eye, intermediate dry stage: Secondary | ICD-10-CM | POA: Diagnosis not present

## 2019-08-23 DIAGNOSIS — H5201 Hypermetropia, right eye: Secondary | ICD-10-CM | POA: Diagnosis not present

## 2019-08-23 DIAGNOSIS — H353121 Nonexudative age-related macular degeneration, left eye, early dry stage: Secondary | ICD-10-CM | POA: Diagnosis not present

## 2019-08-23 DIAGNOSIS — H524 Presbyopia: Secondary | ICD-10-CM | POA: Diagnosis not present

## 2020-02-25 DIAGNOSIS — H353121 Nonexudative age-related macular degeneration, left eye, early dry stage: Secondary | ICD-10-CM | POA: Diagnosis not present

## 2020-02-25 DIAGNOSIS — D3132 Benign neoplasm of left choroid: Secondary | ICD-10-CM | POA: Diagnosis not present

## 2020-02-25 DIAGNOSIS — H353112 Nonexudative age-related macular degeneration, right eye, intermediate dry stage: Secondary | ICD-10-CM | POA: Diagnosis not present

## 2020-02-25 DIAGNOSIS — H40013 Open angle with borderline findings, low risk, bilateral: Secondary | ICD-10-CM | POA: Diagnosis not present

## 2020-11-04 ENCOUNTER — Other Ambulatory Visit: Payer: Self-pay | Admitting: Urology

## 2020-11-04 DIAGNOSIS — R972 Elevated prostate specific antigen [PSA]: Secondary | ICD-10-CM

## 2020-11-22 ENCOUNTER — Ambulatory Visit
Admission: RE | Admit: 2020-11-22 | Discharge: 2020-11-22 | Disposition: A | Discharge status: Home / Self Care | Payer: 59 | Source: Ambulatory Visit | Attending: Urology | Admitting: Urology

## 2020-11-22 ENCOUNTER — Other Ambulatory Visit: Payer: Self-pay

## 2020-11-22 DIAGNOSIS — R972 Elevated prostate specific antigen [PSA]: Secondary | ICD-10-CM

## 2020-11-22 MED ORDER — GADOBENATE DIMEGLUMINE 529 MG/ML IV SOLN
20.0000 mL | Freq: Once | INTRAVENOUS | Status: AC | PRN
  Administered 2020-11-22: 20 mL via INTRAVENOUS

## 2021-12-06 IMAGING — MR MR PROSTATE WO/W CM
12 series · 48 of 48 positions shown · IV contrast (multihance)
Comparison: None.

CLINICAL DATA: Elevated PSA.

EXAM:
MR PROSTATE WITHOUT AND WITH CONTRAST
TECHNIQUE: Multiplanar multisequence MRI images were obtained of the pelvis
centered about the prostate. Pre and post contrast images were
obtained.
CONTRAST:  20mL MULTIHANCE GADOBENATE DIMEGLUMINE 529 MG/ML IV SOLN

[Series 3: T2 · coronal · 3.0mm · 0.56mm/px · 1 of 23 slices shown (1 of 3)]
[im 1/23]
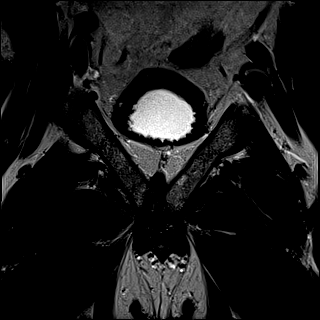

[Series 4: T1 · axial · 5.0mm · 1.25mm/px · 1 of 80 slices shown]
[im 1/80]
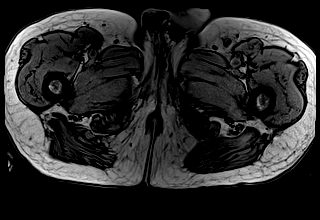

[Series 5: DWI · axial · 3.0mm · 1.75mm/px · z∈[+40,+103]mm · 2 of 66 slices shown (1 of 3)]
[im 1/66]
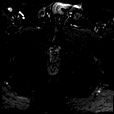
[im 66/66]
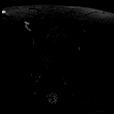

[Series 6: DWI · axial · 3.0mm · 1.75mm/px · 1 of 22 slices shown (2 of 3)]
[im 1/22]
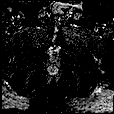

[Series 7: DWI · axial · 3.0mm · 1.75mm/px · 1 of 22 slices shown (3 of 3)]
[im 1/22]
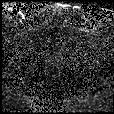

[Series 8: T2 · axial · 3.0mm · 0.56mm/px · 1 of 23 slices shown (2 of 3)]
[im 1/23]
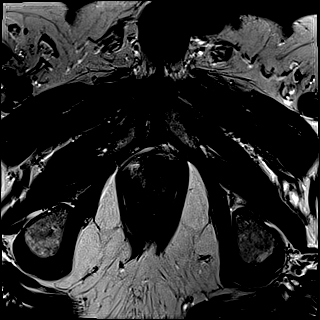

[Series 9: T2 · axial · 1.0mm · 1.04mm/px · z∈[+33,+104]mm · 2 of 72 slices shown (3 of 3)]
[im 1/72]
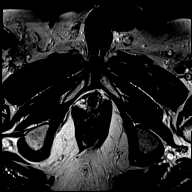
[im 72/72]
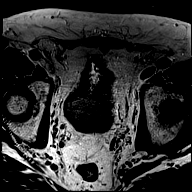

[Series 10: pre t1_twist_tra_dyn · axial · non-contrast · 3.5mm · 0.83mm/px · 1 of 20 slices shown]
[im 1/20]
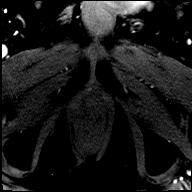

[Series 11: post t1_twist_tra_dyn-copy center · axial · non-contrast · 3.5mm · 0.83mm/px · z∈[+35,+102]mm · 17 of 600 slices shown]
[im 1/600]
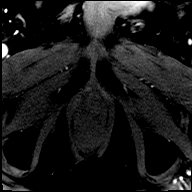
[im 38/600]
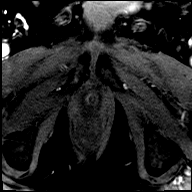
[im 75/600]
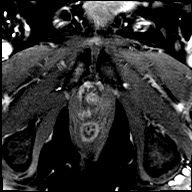
[im 113/600]
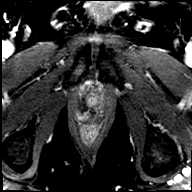
[im 150/600]
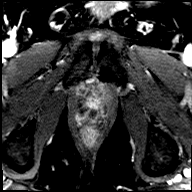
[im 188/600]
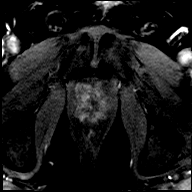
[im 225/600]
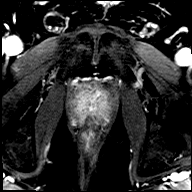
[im 263/600]
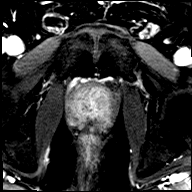
[im 300/600]
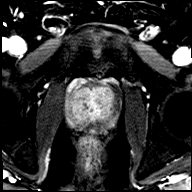
[im 337/600]
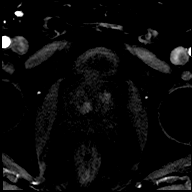
[im 375/600]
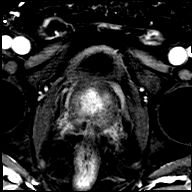
[im 412/600]
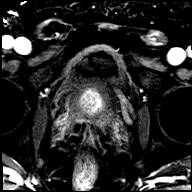
[im 450/600]
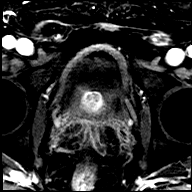
[im 487/600]
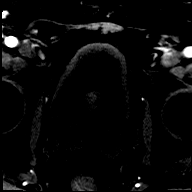
[im 525/600]
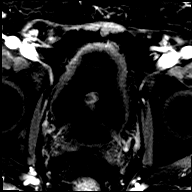
[im 562/600]
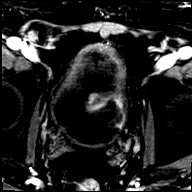
[im 600/600]
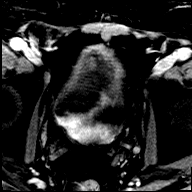

[Series 12: post t1_twist_tra_dyn-copy cent_sub · axial · 3.5mm · 0.83mm/px · z∈[+35,+102]mm · 17 of 580 slices shown]
[im 1/580]
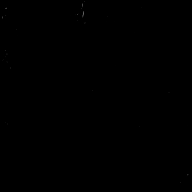
[im 37/580]
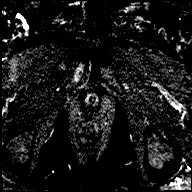
[im 73/580]
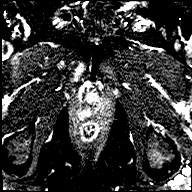
[im 109/580]
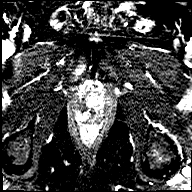
[im 145/580]
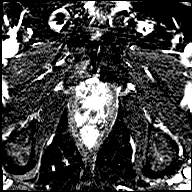
[im 181/580]
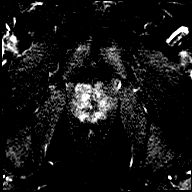
[im 218/580]
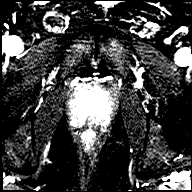
[im 254/580]
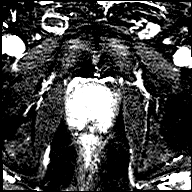
[im 290/580]
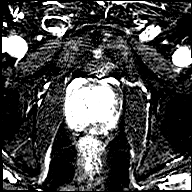
[im 326/580]
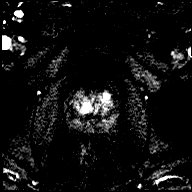
[im 362/580]
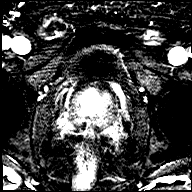
[im 399/580]
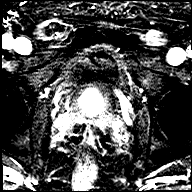
[im 435/580]
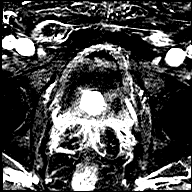
[im 471/580]
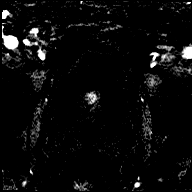
[im 507/580]
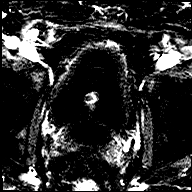
[im 543/580]
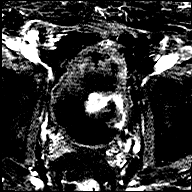
[im 580/580]
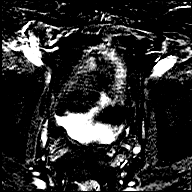

[Series 13: t1_vibe_dixon_tra_f · axial · 2.5mm · 0.91mm/px · z∈[+6,+204]mm · 2 of 80 slices shown]
[im 1/80]
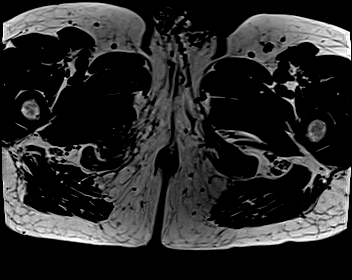
[im 80/80]
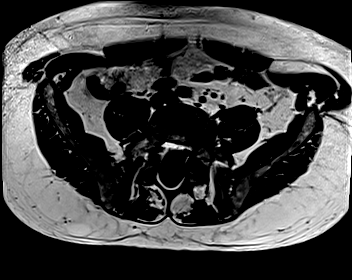

[Series 14: t1_vibe_dixon_tra_w · axial · 2.5mm · 0.91mm/px · z∈[+6,+204]mm · 2 of 80 slices shown]
[im 1/80]
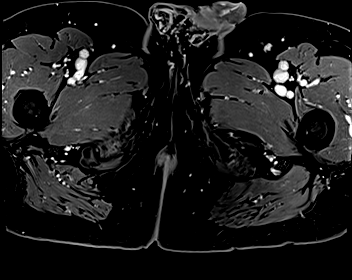
[im 80/80]
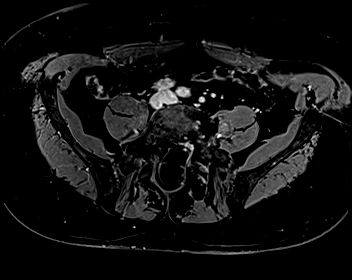

[48 of 48 positions shown; findings below may reference images not displayed]

FINDINGS: Prostate:

-- Peripheral Zone: Linear/wedge shaped hypointensities are noted on
ADC; however, no focal ADC hypointense or high b-value DWI
hyperintense nodules are identified.

-- Transition/Central Zone: Mild enlarged with encapsulated BPH
nodules noted, largest in the medial lobe measuring 1.7 cm which
indents the bladder base; however, no suspicious nodules with
obscured margins or significantly restricted diffusion seen.

-- Measurements/Volume:  5.0 by 4.4 x 4.9 cm (volume = 56 cm^3)

Transcapsular spread:  Absent

Seminal vesicle involvement:  Absent

Neurovascular bundle involvement:  Absent

Pelvic adenopathy: None visualized

Bone metastasis: None visualized

Other: Mild diffuse bladder wall thickening, consistent with chronic
bladder outlet obstruction.
IMPRESSION: No radiographic evidence of high-grade prostate carcinoma. PI-RADS 2
(v.2.1): Low (clinically significant cancer unlikely)

## 2023-08-31 DIAGNOSIS — H40019 Open angle with borderline findings, low risk, unspecified eye: Secondary | ICD-10-CM | POA: Diagnosis not present

## 2023-08-31 DIAGNOSIS — H353132 Nonexudative age-related macular degeneration, bilateral, intermediate dry stage: Secondary | ICD-10-CM | POA: Diagnosis not present

## 2023-08-31 DIAGNOSIS — D3132 Benign neoplasm of left choroid: Secondary | ICD-10-CM | POA: Diagnosis not present

## 2023-12-12 DIAGNOSIS — R972 Elevated prostate specific antigen [PSA]: Secondary | ICD-10-CM | POA: Diagnosis not present

## 2024-01-17 ENCOUNTER — Other Ambulatory Visit: Payer: Self-pay | Admitting: Family Medicine

## 2024-01-17 DIAGNOSIS — E7800 Pure hypercholesterolemia, unspecified: Secondary | ICD-10-CM

## 2024-01-25 ENCOUNTER — Ambulatory Visit
Admission: RE | Admit: 2024-01-25 | Discharge: 2024-01-25 | Disposition: A | Discharge status: Home / Self Care | Source: Ambulatory Visit | Attending: Family Medicine | Admitting: Family Medicine

## 2024-01-25 DIAGNOSIS — E7800 Pure hypercholesterolemia, unspecified: Secondary | ICD-10-CM

## 2024-02-13 DIAGNOSIS — R972 Elevated prostate specific antigen [PSA]: Secondary | ICD-10-CM | POA: Diagnosis not present

## 2024-02-13 DIAGNOSIS — R399 Unspecified symptoms and signs involving the genitourinary system: Secondary | ICD-10-CM | POA: Diagnosis not present

## 2024-02-13 DIAGNOSIS — C61 Malignant neoplasm of prostate: Secondary | ICD-10-CM | POA: Diagnosis not present

## 2024-02-13 DIAGNOSIS — N4231 Prostatic intraepithelial neoplasia: Secondary | ICD-10-CM | POA: Diagnosis not present

## 2024-02-29 DIAGNOSIS — C61 Malignant neoplasm of prostate: Secondary | ICD-10-CM | POA: Diagnosis not present

## 2024-03-24 DIAGNOSIS — C61 Malignant neoplasm of prostate: Secondary | ICD-10-CM | POA: Diagnosis not present

## 2024-04-10 DIAGNOSIS — E7800 Pure hypercholesterolemia, unspecified: Secondary | ICD-10-CM | POA: Diagnosis not present
